# Patient Record
Sex: Male | Born: 1980 | Race: Black or African American | Hispanic: No | Marital: Married | Smoking: Former smoker
Health system: Southern US, Community
[De-identification: ages and names within clinical notes are randomized; demographics above are authoritative.]

## PROBLEM LIST (undated history)

## (undated) ENCOUNTER — Emergency Department (HOSPITAL_BASED_OUTPATIENT_CLINIC_OR_DEPARTMENT_OTHER): Admission: EM | Payer: Self-pay

## (undated) DIAGNOSIS — J40 Bronchitis, not specified as acute or chronic: Secondary | ICD-10-CM

---

## 2004-04-02 ENCOUNTER — Emergency Department (HOSPITAL_COMMUNITY): Admission: EM | Admit: 2004-04-02 | Discharge: 2004-04-02 | Payer: Self-pay | Admitting: Emergency Medicine

## 2014-07-04 ENCOUNTER — Encounter (HOSPITAL_BASED_OUTPATIENT_CLINIC_OR_DEPARTMENT_OTHER): Payer: Self-pay | Admitting: Emergency Medicine

## 2014-07-04 ENCOUNTER — Emergency Department (HOSPITAL_BASED_OUTPATIENT_CLINIC_OR_DEPARTMENT_OTHER)
Admission: EM | Admit: 2014-07-04 | Discharge: 2014-07-05 | Disposition: A | Discharge status: Home / Self Care | Payer: Self-pay | Attending: Emergency Medicine | Admitting: Emergency Medicine

## 2014-07-04 DIAGNOSIS — N342 Other urethritis: Secondary | ICD-10-CM | POA: Insufficient documentation

## 2014-07-04 LAB — URINALYSIS, ROUTINE W REFLEX MICROSCOPIC
Bilirubin Urine: NEGATIVE
GLUCOSE, UA: NEGATIVE mg/dL
HGB URINE DIPSTICK: NEGATIVE
KETONES UR: NEGATIVE mg/dL
Nitrite: NEGATIVE
PH: 8 (ref 5.0–8.0)
Protein, ur: NEGATIVE mg/dL
Specific Gravity, Urine: 1.024 (ref 1.005–1.030)
Urobilinogen, UA: 1 mg/dL (ref 0.0–1.0)

## 2014-07-04 LAB — URINE MICROSCOPIC-ADD ON

## 2014-07-04 MED ORDER — PHENAZOPYRIDINE HCL 100 MG PO TABS: 200.0000 mg | ORAL_TABLET | Freq: Once | ORAL | Status: DC

## 2014-07-04 MED ORDER — NITROFURANTOIN MONOHYD MACRO 100 MG PO CAPS: 100.0000 mg | ORAL_CAPSULE | Freq: Once | ORAL | Status: DC

## 2014-07-04 NOTE — ED Provider Notes (Signed)
CSN: [id number]     Arrival date & time 07/04/14  2314 History   This chart was scribed for Chrisy Hillebrand, MD by Octavia Heir, ED Scribe. This patient was seen in room MHT13/MHT13 and the patient's care was started at 11:57 PM.    Chief Complaint  Patient presents with  . Urinary Tract Infection     Patient is a 34 y.o. male presenting with penile discharge and dysuria. The history is provided by the patient. No language interpreter was used.  Penile Discharge This is a new problem. The current episode started more than 1 week ago. The problem occurs constantly. The problem has not changed since onset.Pertinent negatives include no chest pain, no abdominal pain, no headaches and no shortness of breath. Nothing aggravates the symptoms. Nothing relieves the symptoms. He has tried nothing for the symptoms. The treatment provided no relief.  Dysuria This is a new problem. The current episode started more than 1 week ago. The problem occurs constantly. The problem has not changed since onset.Pertinent negatives include no chest pain, no abdominal pain, no headaches and no shortness of breath. Nothing aggravates the symptoms. Nothing relieves the symptoms. He has tried nothing for the symptoms. The treatment provided no relief.    HPI Comments: Dwayne Hicks is a 34 y.o. male who presents to the Emergency Department complaining of penile discharge onset two weeks ago. Pt has associated dysuria. Pt was tested for STDs two weeks ago and notes his tests were negative. Pt denies any new partners and having history of UTI's in the past.   History reviewed. No pertinent past medical history. History reviewed. No pertinent past surgical history. History reviewed. No pertinent family history. History  Substance Use Topics  . Smoking status: Never Smoker   . Smokeless tobacco: Not on file  . Alcohol Use: Yes     Comment: occ    Review of Systems  Constitutional: Negative for fever.  Respiratory:  Negative for shortness of breath.   Cardiovascular: Negative for chest pain.  Gastrointestinal: Negative for abdominal pain.  Genitourinary: Positive for dysuria and discharge. Negative for frequency, flank pain, penile swelling, scrotal swelling, genital sores, penile pain and testicular pain.  Neurological: Negative for headaches.  All other systems reviewed and are negative.     Allergies  Review of patient's allergies indicates no known allergies.  Home Medications   Prior to Admission medications   Not on File   Triage vitals: BP 116/75 mmHg  Pulse 89  Temp(Src) 98.5 F (36.9 C) (Oral)  Resp 16  Ht 5' (1.524 m)  SpO2 100% Physical Exam  Constitutional: He is oriented to person, place, and time. He appears well-developed and well-nourished. No distress.  HENT:  Head: Normocephalic and atraumatic.  Mouth/Throat: Oropharynx is clear and moist.  Eyes: Conjunctivae and EOM are normal. Pupils are equal, round, and reactive to light. No scleral icterus.  Neck: Normal range of motion. Neck supple. No thyromegaly present.  Cardiovascular: Normal rate and regular rhythm.  Exam reveals no gallop and no friction rub.   No murmur heard. Pulmonary/Chest: Effort normal and breath sounds normal. No respiratory distress. He has no wheezes. He has no rales.  Abdominal: Soft. Bowel sounds are normal. He exhibits no distension. There is no tenderness. There is no rebound and no guarding.  Musculoskeletal: Normal range of motion.  Lymphadenopathy:    He has no cervical adenopathy.  Neurological: He is alert and oriented to person, place, and time. He has normal  reflexes.  Skin: Skin is warm and dry. No rash noted.  Psychiatric: He has a normal mood and affect. His behavior is normal.  Nursing note and vitals reviewed.   ED Course  Procedures  DIAGNOSTIC STUDIES: Oxygen Saturation is 100% on RA, normal by my interpretation.  COORDINATION OF CARE:  11:59 PM Discussed treatment plan  which includes follow up with urology, rocephin with pt at bedside and pt agreed to plan.  Labs Review Labs Reviewed  URINALYSIS, ROUTINE W REFLEX MICROSCOPIC (NOT AT [facility]) - Abnormal; Notable for the following:    APPearance TURBID (*)    Leukocytes, UA LARGE (*)    All other components within normal limits  URINE MICROSCOPIC-ADD ON - Abnormal; Notable for the following:    Bacteria, UA MANY (*)    All other components within normal limits    Imaging Review No results found.   EKG Interpretation None      MDM   Final diagnoses:  None    Will treat for STIs with rocephin at full UTI dose Im and azithromycin.  Will d/c on doxycycline.  .  No sexual activity until 7 days after all partners treated.  Will also provide follow up with urology.  Patient verbalizes understanding and agrees to follow up  I personally performed the services described in this documentation, which was scribed in my presence. The recorded information has been reviewed and is accurate.    Cy Blamer, MD 07/05/14 [phone]

## 2014-07-04 NOTE — ED Notes (Signed)
Patient states that he was test about 2 [facility] and it was negative. The patient reports that he continues to have a discharge and left flank pain

## 2014-07-05 ENCOUNTER — Encounter (HOSPITAL_BASED_OUTPATIENT_CLINIC_OR_DEPARTMENT_OTHER): Payer: Self-pay | Admitting: Emergency Medicine

## 2014-07-05 MED ORDER — CEFTRIAXONE SODIUM 1 G IJ SOLR
1.0000 g | Freq: Once | INTRAMUSCULAR | Status: AC
  Administered 2014-07-05: 1 g via INTRAMUSCULAR
  Filled 2014-07-05: qty 10

## 2014-07-05 MED ORDER — DOXYCYCLINE HYCLATE 100 MG PO CAPS: 100.0000 mg | ORAL_CAPSULE | Freq: Two times a day (BID) | ORAL | Status: AC

## 2014-07-05 NOTE — Discharge Instructions (Signed)
Urethritis °Urethritis is an inflammation of the tube through which urine exits your bladder (urethra).  °CAUSES °Urethritis is often caused by an infection in your urethra. The infection can be viral, like herpes. The infection can also be bacterial, like gonorrhea. °RISK FACTORS °Risk factors of urethritis include: °· Having sex without using a condom. °· Having multiple sexual partners. °· Having poor hygiene. °SIGNS AND SYMPTOMS °Symptoms of urethritis are less noticeable in women than in men. These symptoms include: °· Burning feeling when you urinate (dysuria). °· Discharge from your urethra. °· Blood in your urine (hematuria). °· Urinating more than usual. °DIAGNOSIS  °To confirm a diagnosis of urethritis, your health care provider will do the following: °· Ask about your sexual history. °· Perform a physical exam. °· Have you provide a sample of your urine for lab testing. °· Use a cotton swab to gently collect a sample from your urethra for lab testing. °TREATMENT  °It is important to treat urethritis. Depending on the cause, untreated urethritis may lead to serious genital infections and possibly infertility. Urethritis caused by a bacterial infection is treated with antibiotic medicine. All sexual partners must be treated.  °HOME CARE INSTRUCTIONS °· Do not have sex until the test results are known and treatment is completed, even if your symptoms go away before you finish treatment. °· If you were prescribed an antibiotic, finish it all even if you start to feel better. °SEEK MEDICAL CARE IF:  °· Your symptoms are not improved in 3 days. °· Your symptoms are getting worse. °· You develop abdominal pain or pelvic pain (in women). °· You develop joint pain. °· You have a fever. °SEEK IMMEDIATE MEDICAL CARE IF:  °· You have severe pain in the belly, back, or side. °· You have repeated vomiting. °MAKE SURE YOU: °· Understand these instructions. °· Will watch your condition. °· Will get help right away if you  are not doing well or get worse. °Document Released: 06/26/2000 Document Revised: 05/17/2013 Document Reviewed: 08/31/2012 °[employer]® Patient Information ©2015 [employer]. This information is not intended to replace advice given to you by your health care provider. Make sure you discuss any questions you have with your health care provider. ° °

## 2014-07-06 LAB — GC/CHLAMYDIA PROBE AMP (~~LOC~~) NOT AT ARMC
CHLAMYDIA, DNA PROBE: NEGATIVE
Neisseria Gonorrhea: POSITIVE — AB

## 2014-07-07 ENCOUNTER — Telehealth (HOSPITAL_BASED_OUTPATIENT_CLINIC_OR_DEPARTMENT_OTHER): Payer: Self-pay | Admitting: Emergency Medicine

## 2014-07-31 ENCOUNTER — Telehealth (HOSPITAL_BASED_OUTPATIENT_CLINIC_OR_DEPARTMENT_OTHER): Payer: Self-pay | Admitting: Emergency Medicine

## 2014-07-31 NOTE — Telephone Encounter (Signed)
Letter returned, no known forwarding address, lost to followup 

## 2014-08-27 ENCOUNTER — Encounter (HOSPITAL_BASED_OUTPATIENT_CLINIC_OR_DEPARTMENT_OTHER): Payer: Self-pay | Admitting: *Deleted

## 2014-08-27 ENCOUNTER — Emergency Department (HOSPITAL_BASED_OUTPATIENT_CLINIC_OR_DEPARTMENT_OTHER)
Admission: EM | Admit: 2014-08-27 | Discharge: 2014-08-27 | Disposition: A | Discharge status: Home / Self Care | Payer: Self-pay | Attending: Emergency Medicine | Admitting: Emergency Medicine

## 2014-08-27 DIAGNOSIS — Z87891 Personal history of nicotine dependence: Secondary | ICD-10-CM | POA: Insufficient documentation

## 2014-08-27 DIAGNOSIS — R002 Palpitations: Secondary | ICD-10-CM | POA: Insufficient documentation

## 2014-08-27 DIAGNOSIS — R42 Dizziness and giddiness: Secondary | ICD-10-CM | POA: Insufficient documentation

## 2014-08-27 DIAGNOSIS — Z8709 Personal history of other diseases of the respiratory system: Secondary | ICD-10-CM | POA: Insufficient documentation

## 2014-08-27 DIAGNOSIS — R200 Anesthesia of skin: Secondary | ICD-10-CM | POA: Insufficient documentation

## 2014-08-27 DIAGNOSIS — R079 Chest pain, unspecified: Secondary | ICD-10-CM | POA: Insufficient documentation

## 2014-08-27 DIAGNOSIS — R202 Paresthesia of skin: Secondary | ICD-10-CM | POA: Insufficient documentation

## 2014-08-27 HISTORY — DX: Bronchitis, not specified as acute or chronic: J40

## 2014-08-27 LAB — CBC WITH DIFFERENTIAL/PLATELET
Basophils Absolute: 0 10*3/uL (ref 0.0–0.1)
Basophils Relative: 0 % (ref 0–1)
EOS ABS: 0.1 10*3/uL (ref 0.0–0.7)
EOS PCT: 1 % (ref 0–5)
HCT: 45.6 % (ref 39.0–52.0)
Hemoglobin: 15.5 g/dL (ref 13.0–17.0)
LYMPHS PCT: 15 % (ref 12–46)
Lymphs Abs: 1.1 10*3/uL (ref 0.7–4.0)
MCH: 32.8 pg (ref 26.0–34.0)
MCHC: 34 g/dL (ref 30.0–36.0)
MCV: 96.4 fL (ref 78.0–100.0)
MONO ABS: 0.5 10*3/uL (ref 0.1–1.0)
Monocytes Relative: 7 % (ref 3–12)
NEUTROS ABS: 5.7 10*3/uL (ref 1.7–7.7)
NEUTROS PCT: 77 % (ref 43–77)
PLATELETS: 272 10*3/uL (ref 150–400)
RBC: 4.73 MIL/uL (ref 4.22–5.81)
RDW: 12.7 % (ref 11.5–15.5)
WBC: 7.4 10*3/uL (ref 4.0–10.5)

## 2014-08-27 LAB — BASIC METABOLIC PANEL
Anion gap: 10 (ref 5–15)
BUN: 11 mg/dL (ref 6–20)
CHLORIDE: 102 mmol/L (ref 101–111)
CO2: 28 mmol/L (ref 22–32)
Calcium: 10.2 mg/dL (ref 8.9–10.3)
Creatinine, Ser: 1.12 mg/dL (ref 0.61–1.24)
GFR calc non Af Amer: >60 mL/min (ref >60)
GLUCOSE: 86 mg/dL (ref 65–99)
POTASSIUM: 3.8 mmol/L (ref 3.5–5.1)
Sodium: 140 mmol/L (ref 135–145)

## 2014-08-27 LAB — TROPONIN I: Troponin I: <0.03 ng/mL (ref <0.031)

## 2014-08-27 MED ORDER — SODIUM CHLORIDE 0.9 % IV BOLUS (SEPSIS)
1000.0000 mL | Freq: Once | INTRAVENOUS | Status: AC
  Administered 2014-08-27: 1000 mL via INTRAVENOUS

## 2014-08-27 NOTE — ED Notes (Signed)
Pt reports "i think i'm having a stroke"- c/o fast heartbeat and dizzy x 4 days, left side of body tingling since 1530 today- speech clear, grips strong and equal, facial symmetry present

## 2014-08-27 NOTE — ED Provider Notes (Signed)
CSN: [id number]     Arrival date & time 08/27/14  1626 History  This chart was scribed for Blake Divine, MD by Placido Sou, ED scribe. This patient was seen in room MH07/MH07 and the patient's care was started at 4:51 PM.   Chief Complaint  Patient presents with  . Tingling   Patient is a 34 y.o. male presenting with dizziness. The history is provided by the patient. No language interpreter was used.  Dizziness Quality:  Lightheadedness Severity:  Moderate Onset quality:  Sudden Duration:  4 days Timing:  Intermittent Progression:  Partially resolved Chronicity:  New Associated symptoms: palpitations     HPI Comments: Dwayne Hicks is a 34 y.o. male who presents to the Emergency Department complaining of sudden, acute, mild, intermittent, left sided numbness with onset 4 days ago. He notes that while driving 4 days ago he experienced a racing sensation of his heart and once he arrived back home began feeling numbness to his left side with associated dizziness and mild chest tighness. He notes multiple occurences of these episodes in the past 4 days and further notes that they typically last from 5-30 minutes. Pt notes taking an unprescribed valium 5 days ago before the onset of his symptoms. He notes that his symptoms have alleviated quite a bit and currently are mildly present. He denies consuming excessive amounts of caffeine, ETOH or any cocaine usage. He does report being under stress, but he doesn't think it's any worse than most people deal with. Pt denies any known health issues or being on any current medications. He is unsure of any fhx due to being adopted. Pt denies any other associated symptoms.   Past Medical History  Diagnosis Date  . Bronchitis    History reviewed. No pertinent past surgical history. No family history on file. Social History  Substance Use Topics  . Smoking status: Former Games developer  . Smokeless tobacco: Never Used  . Alcohol Use: Yes     Comment: 4  drinks/weekend    Review of Systems  Respiratory: Positive for chest tightness.   Cardiovascular: Positive for palpitations.  Neurological: Positive for dizziness and numbness. Negative for syncope.  All other systems reviewed and are negative.   Allergies  Review of patient's allergies indicates no known allergies.  Home Medications   Prior to Admission medications   Medication Sig Start Date End Date Taking? Authorizing Provider  doxycycline (VIBRAMYCIN) 100 MG capsule Take 1 capsule (100 mg total) by mouth 2 (two) times daily. One po bid x 7 days 07/05/14   April Palumbo, MD   BP 147/87 mmHg  Pulse 76  Temp(Src) 98.6 F (37 C) (Oral)  Resp 18  Ht  (1.702 m)  Wt 140 lb (63.504 kg)  BMI 21.92 kg/m2  SpO2 96% Physical Exam  Constitutional: He is oriented to person, place, and time. He appears well-developed and well-nourished. No distress.  Thin  HENT:  Head: Normocephalic and atraumatic.  Mouth/Throat: Oropharynx is clear and moist.  Eyes: Conjunctivae are normal. Pupils are equal, round, and reactive to light. No scleral icterus.  Neck: Neck supple.  Cardiovascular: Normal rate, regular rhythm, normal heart sounds and intact distal pulses.   No murmur heard. Pulmonary/Chest: Effort normal and breath sounds normal. No stridor. No respiratory distress. He has no wheezes. He has no rales.  Abdominal: Soft. He exhibits no distension. There is no tenderness.  Musculoskeletal: Normal range of motion. He exhibits no edema.  Neurological: He is alert and oriented  to person, place, and time. He has normal strength. No cranial nerve deficit or sensory deficit. Coordination normal. GCS eye subscore is 4. GCS verbal subscore is 5. GCS motor subscore is 6.  Skin: Skin is warm and dry. No rash noted.  Psychiatric: He has a normal mood and affect. His behavior is normal.  Nursing note and vitals reviewed.   ED Course  Procedures  DIAGNOSTIC STUDIES: Oxygen Saturation is 96%  on RA, normal by my interpretation.    COORDINATION OF CARE: 5:00 PM Discussed treatment plan with pt at bedside and pt agreed to plan.  Labs Review Labs Reviewed  CBC WITH DIFFERENTIAL/PLATELET  BASIC METABOLIC PANEL  TROPONIN I    Imaging Review No results found. I, Blake Divine, MD, personally reviewed and evaluated these images and lab results as part of my medical decision-making.   EKG Interpretation   Date/Time:  Saturday August 27 2014 17:04:26 EDT Ventricular Rate:  65 PR Interval:  150 QRS Duration: 84 QT Interval:  368 QTC Calculation: 382 R Axis:   -8 Text Interpretation:  Normal sinus rhythm Left ventricular hypertrophy ST  elevation, consider early repolarization, pericarditis, or injury Abnormal  ECG No old tracing to compare Confirmed by [facility]  MD, TREY ([id number]) on  08/27/2014 5:16:35 PM      MDM   Final diagnoses:  Palpitations    34 year old male who presents with palpitations, dizziness, near syncope, and tingling mostly on the left side of his body. The symptoms have been off and on for the past few days. He has not passed out.  Currently, he is asymptomatic with stable vitals. His EKG shows normal sinus rhythm without delta waves. Does show a pattern consistent with early repolarization, expected given his age and body type. He does admit to being outside in the heat frequently and reports that he's been very hot recently.  This could be contributing to his symptoms. Also, anxiety could play a role. Less likely is a cardiac arrhythmia. His workup is reassuring here. Will DC home with outpatient follow-up.  I personally performed the services described in this documentation, which was scribed in my presence. The recorded information has been reviewed and is accurate.   Blake Divine, MD 08/27/14 [phone]

## 2014-08-27 NOTE — ED Notes (Addendum)
Pt complains of left sided tingles about 1 hour ago. Pt complains of heart racing, sweating, "feeling of passing out" x4days. Denies any blurred vision or headache

## 2014-08-27 NOTE — ED Notes (Signed)
Pt presents with c/o "fast heart rate" and " I think I am having a stroke", x 4 days, POX on RA 98% with HR 74/min, FAST Assessment: negative

## 2014-08-27 NOTE — Discharge Instructions (Signed)
Palpitations °A palpitation is the feeling that your heartbeat is irregular or is faster than normal. It may feel like your heart is fluttering or skipping a beat. Palpitations are usually not a serious problem. However, in some cases, you may need further medical evaluation. °CAUSES  °Palpitations can be caused by: °· Smoking. °· Caffeine or other stimulants, such as diet pills or energy drinks. °· Alcohol. °· Stress and anxiety. °· Strenuous physical activity. °· Fatigue. °· Certain medicines. °· Heart disease, especially if you have a history of irregular heart rhythms (arrhythmias), such as atrial fibrillation, atrial flutter, or supraventricular tachycardia. °· An improperly working pacemaker or defibrillator. °DIAGNOSIS  °To find the cause of your palpitations, your health care provider will take your medical history and perform a physical exam. Your health care provider may also have you take a test called an ambulatory electrocardiogram (ECG). An ECG records your heartbeat patterns over a 24-hour period. You may also have other tests, such as: °· Transthoracic echocardiogram (TTE). During echocardiography, sound waves are used to evaluate how blood flows through your heart. °· Transesophageal echocardiogram (TEE). °· Cardiac monitoring. This allows your health care provider to monitor your heart rate and rhythm in real time. °· Holter monitor. This is a portable device that records your heartbeat and can help diagnose heart arrhythmias. It allows your health care provider to track your heart activity for several days, if needed. °· Stress tests by exercise or by giving medicine that makes the heart beat faster. °TREATMENT  °Treatment of palpitations depends on the cause of your symptoms and can vary greatly. Most cases of palpitations do not require any treatment other than time, relaxation, and monitoring your symptoms. Other causes, such as atrial fibrillation, atrial flutter, or supraventricular  tachycardia, usually require further treatment. °HOME CARE INSTRUCTIONS  °· Avoid: °¨ Caffeinated coffee, tea, soft drinks, diet pills, and energy drinks. °¨ Chocolate. °¨ Alcohol. °· Stop smoking if you smoke. °· Reduce your stress and anxiety. Things that can help you relax include: °¨ A method of controlling things in your body, such as your heartbeats, with your mind (biofeedback). °¨ Yoga. °¨ Meditation. °¨ Physical activity such as swimming, jogging, or walking. °· Get plenty of rest and sleep. °SEEK MEDICAL CARE IF:  °· You continue to have a fast or irregular heartbeat beyond 24 hours. °· Your palpitations occur more often. °SEEK IMMEDIATE MEDICAL CARE IF: °· You have chest pain or shortness of breath. °· You have a severe headache. °· You feel dizzy or you faint. °MAKE SURE YOU: °· Understand these instructions. °· Will watch your condition. °· Will get help right away if you are not doing well or get worse. °Document Released: 12/29/1999 Document Revised: 01/05/2013 Document Reviewed: 03/01/2011 °ExitCare® Patient Information ©2015 [employer]. This information is not intended to replace advice given to you by your health care provider. Make sure you discuss any questions you have with your health care provider. ° ° °Emergency Department Resource Guide °1) Find a Doctor and Pay Out of Pocket °Although you won't have to find out who is covered by your insurance plan, it is a good idea to ask around and get recommendations. You will then need to call the office and see if the doctor you have chosen will accept you as a new patient and what types of options they offer for patients who are self-pay. Some doctors offer discounts or will set up payment plans for their patients who do not have insurance, but   you will need to ask so you aren't surprised when you get to your appointment.  2) Contact Your Local Health Department Not all health departments have doctors that can see patients for sick visits, but  many do, so it is worth a call to see if yours does. If you don't know where your local health department is, you can check in your phone book. The CDC also has a tool to help you locate your state's health department, and many state websites also have listings of all of their local health departments.  3) Find a Elim Clinic If your illness is not likely to be very severe or complicated, you may want to try a walk in clinic. These are popping up all over the country in pharmacies, drugstores, and shopping centers. They're usually staffed by nurse practitioners or physician assistants that have been trained to treat common illnesses and complaints. They're usually fairly quick and inexpensive. However, if you have serious medical issues or chronic medical problems, these are probably not your best option.  No Primary Care Doctor: - Call Health Connect at  [phone] - they can help you locate a primary care doctor that  accepts your insurance, provides certain services, etc. - Physician Referral Service- [phone]  Chronic Pain Problems: Organization         Address  Phone   Notes  [facility]  [phone] Patients need to be referred by their primary care doctor.   Medication Assistance: Organization         Address  Phone   Notes  [facility] Medication [facility] [address] [place], [place] [address] [phone] --Must be a resident of [facility] -- Must have NO insurance coverage whatsoever (no Medicaid/ Medicare, etc.) -- The pt. MUST have a primary care doctor that directs their care regularly and follows them in the community   [facility]ist  [phone]   [employer]  [phone]    Agencies that provide inexpensive medical care: Organization         Address  Phone   Notes  [place]  [phone]   [facility]    [phone]   [facility] [place], [place] [address] [phone]   [place] [address], [place] [phone]   Planned Parenthood    [phone]   [facility]    [phone]   [place] and [place] [address], [place] Phone:  [phone], Fax:  [phone] Hours of Operation:  9 am - 6 pm, M-F.  Also accepts Medicaid/Medicare and self-pay.  [facility] [place] [address], [address], [place] Phone: [phone], Fax: [phone]. Hours of Operation:  8:30 am - 5:30 pm, M-F.  Also accepts Medicaid and self-pay.  [facility] [address] [place] Phone: [phone]   [place], [place], [place] [phone], Ext. [phone] Mondays & Thursdays: 7-9 AM.  First 15 patients are seen on a first come, first serve basis.    [place] Providers:  Organization         Address  Phone   Notes  [facility] [address], [place] [phone] Also accepts self-pay patients.  [facility] [address], [place]  [place]  [phone]   [place], [address], [place] [phone]   [place] [address] [place] [phone]   Lucianne Lei [address], [place]   [phone] Only accepts [place] Access [place] patients after they have their name applied to their card.   Self-Pay (no insurance) in [facility]:  Organization         Address  Phone   Notes  Sickle Cell Patients, [facility] [place] [phone]   [facility] [place] [phone]   [facility] [place]  [place], [place], [place] [phone]   [facility]/Dr. Osei-Bonsu  [address] [place] or  [address], [place] [phone] Phone number for both [place] and [place] locations is the same.  [facility] and [facility] [address] [place] [phone]   [facility] [address], [place] or [address] [phone]   [facility] [address], [place] [phone], phone; [phone], fax Sees patients 1st and 3rd Saturday of every month.  Must not qualify for public or private insurance (i.e. Medicaid, Medicare, [place] Health Choice, Veterans' Benefits)  Household income should be no more than 200% of the poverty level The clinic cannot treat you if you are pregnant or think you are pregnant  Sexually transmitted diseases are not treated at the clinic.    Dental Care: Organization         Address  Phone  Notes  [facility] Department of [facility] [phone] Accepts children up to age 31 who are enrolled in [place] or [place]; pregnant women with a Medicaid card; and children who have applied for Medicaid or [place] Health Choice, but were declined, whose parents can pay a reduced fee at time of service.  [facility] Department of [facility]  [address], [place] [phone] Accepts children up to age 43 who are enrolled in [place] or [place]; pregnant women with a Medicaid card; and children who have applied for Medicaid or [place] Health Choice, but were declined, whose parents can pay a reduced fee at time of service.  [place] Adult [facility] Access PROGRAM  [place] [phone] Patients are seen by appointment only. Walk-ins are not accepted. [place] will see patients 41 years of age and older. Monday - Tuesday (8am-5pm) Most Wednesdays (8:30-5pm) $30 per visit, cash only  [facility] Adult Dental Access PROGRAM  [address], [facility] [phone] Patients are seen by appointment only. Walk-ins are not accepted. [place] will see patients 43 years of age and older. One Wednesday Evening (Monthly: Volunteer Based).  $30 per visit, cash only  [place]  [phone] for adults; Children under age 67, call Graduate Pediatric Dentistry at [phone]. Children aged 40-14, please call [phone] to request a pediatric application.  Dental services are provided in all areas of dental care including fillings, crowns and bridges, complete and partial dentures, implants, gum treatment, root canals, and extractions. Preventive care is also provided. Treatment is provided to both adults and children. Patients are selected via a lottery  and there is often a waiting list.   [facility] [address] [place]  [phone] [email or web address]   [facility] [address] [place], [place] [phone], Ext. [phone] Second and Fourth Thursday of each month, opens at 6:30 AM; Clinic ends at 9 AM.  Patients are seen on a first-come first-served basis, and a limited number are seen during each clinic.   [facility]  [address] Hillard Danker [place], [place] [phone]   Eligibility Requirements You must have lived in [place], [place], or [place] counties for at least the last three months.   You cannot be eligible for state or federal sponsored [employer], including [employer], [place], or [employer].   You generally cannot be eligible for healthcare insurance through your employer.    How to apply: Eligibility screenings are held every Tuesday and Wednesday afternoon from 1:00 pm until 4:00 pm. You do not need an appointment for the interview!  [facility] [address], [place], [place]   [place]  Blue Department  [place]  [phone]    Behavioral Health Resources in the Community: Intensive Outpatient Programs Organization         Address  Phone  Notes  [place]. [address] [place], [place] [phone]   [facility] Outpatient [address] [place], [place]   ADS: Alcohol & Drug Svcs [address], [place], [place]   [place] [address],  [place], [place] or [phone]   Substance Abuse Resources Organization         Address  Phone  Notes  Alcohol and Drug Services  [phone]   [place]  [phone]   [facility] [place]   Chinita Pester  [phone]   Residential & Outpatient Substance Abuse Program  [phone]   Psychological Services Organization         Address  Phone  Notes  [facility] [place]  [place]  [phone]   [place] [address], [place] or [phone]    Mobile Crisis Teams Organization         Address  Phone  Notes  [facility], Mobile Crisis Care Unit  [phone]   [facility] Psychotherapeutic Services  [address] [place], [place]   [place] [address], [place] [phone]    Self-Help/Support Groups Organization         Address  Phone             Notes  [place] - variety of support groups  [place] Call for more information  Narcotics Anonymous (NA), [facility] [address], [employer] [place]  2 meetings at this location   Special educational needs teacher         Address  Phone  Notes  ASAP Residential Treatment [place],    [place]  [phone]   [facility]  [address], [place] [id number], [place], [place]   [place], [place] [phone]  Admissions: 8am-3pm M-F  Incentives Substance [place] [address]    [place], [place] [phone]   [facility] [address], [place],  [place] [phone]   [facility] [address]  [place], [place]   [facility] - Intensive Outpatient [address], [place], [place]   [facility] ([place].,  [place], [place] [phone] or [phone]   [facility] (RTS) [address] [place], [place] Accepts Medicaid  [facility] [place] [address],  [place] [phone] Substance Abuse/Addiction Treatment   [facility] Organization         Address  Phone  Notes  [facility]  [phone]   Domenic Schwab, PhD [address] Arlis Porta [place], [place]   [phone] or [phone]   [place], [place] [phone]   [address], [place], [place] [phone] Insurance/Medicaid/sponsorship through [facility] and Families [address] [place]                                    [place], [place] [phone] [place] [address], [place] [phone]    Dr. Adele Schilder  [phone]   [facility] Dept. 1) [address] [place] 2) [place] 3)  [place] [address], [place] [phone]  [phone]   [place] [phone] or [phone] (After Hours)

## 2014-08-28 ENCOUNTER — Emergency Department (HOSPITAL_BASED_OUTPATIENT_CLINIC_OR_DEPARTMENT_OTHER)
Admission: EM | Admit: 2014-08-28 | Discharge: 2014-08-29 | Disposition: A | Discharge status: Home / Self Care | Payer: Self-pay | Attending: Emergency Medicine | Admitting: Emergency Medicine

## 2014-08-28 ENCOUNTER — Encounter (HOSPITAL_BASED_OUTPATIENT_CLINIC_OR_DEPARTMENT_OTHER): Payer: Self-pay | Admitting: Emergency Medicine

## 2014-08-28 DIAGNOSIS — Z8709 Personal history of other diseases of the respiratory system: Secondary | ICD-10-CM | POA: Insufficient documentation

## 2014-08-28 DIAGNOSIS — R202 Paresthesia of skin: Secondary | ICD-10-CM | POA: Insufficient documentation

## 2014-08-28 DIAGNOSIS — Z87891 Personal history of nicotine dependence: Secondary | ICD-10-CM | POA: Insufficient documentation

## 2014-08-28 LAB — URINALYSIS, ROUTINE W REFLEX MICROSCOPIC
BILIRUBIN URINE: NEGATIVE
GLUCOSE, UA: NEGATIVE mg/dL
Hgb urine dipstick: NEGATIVE
Ketones, ur: NEGATIVE mg/dL
LEUKOCYTES UA: NEGATIVE
NITRITE: NEGATIVE
PH: 7 (ref 5.0–8.0)
Protein, ur: NEGATIVE mg/dL
Specific Gravity, Urine: 1.009 (ref 1.005–1.030)
Urobilinogen, UA: 1 mg/dL (ref 0.0–1.0)

## 2014-08-28 NOTE — ED Notes (Addendum)
Patient states that he is having intermittent tingling to different parts of his body after riding with the window down. Was here yesterday for the same, however he reports its worse. Patient states that he thinks he has a "blot clot and he wants an x-ray because maybe his heart is over grown"

## 2014-08-28 NOTE — ED Provider Notes (Signed)
CSN: [id number]     Arrival date & time 08/28/14  2322 History  This chart was scribed for Paula Libra, MD by Placido Sou, ED scribe. This patient was seen in room MH02/MH02 and the patient's care was started at 11:51 PM.   Chief Complaint  Patient presents with  . Numbness   HPI HPI Comments: Dwayne Hicks is a 34 y.o. male, who [facility], presents to the Emergency Department complaining of left arm and left leg tingling with onset 30 minutes PTA. He notes while driving experiencing an associated near syncopal feeling, mild pain behind his left ear, nausea and mild left-sided anterior CP he describes as sharp and pinching and lasting about a second. He also notes that his veins were more pronounced in his left arm which is confirmed by his girlfriend. That venous distention has resolved. Pt denies SOB, dehydration, change in urinary frequency, vomiting, diarrhea, fever or chills.   Past Medical History  Diagnosis Date  . Bronchitis    History reviewed. No pertinent past surgical history. History reviewed. No pertinent family history. Social History  Substance Use Topics  . Smoking status: Former Games developer  . Smokeless tobacco: Never Used  . Alcohol Use: Yes     Comment: 4 drinks/weekend    Review of Systems  All other systems reviewed and are negative.  Allergies  Review of patient's allergies indicates no known allergies.  Home Medications   Prior to Admission medications   Medication Sig Start Date End Date Taking? Authorizing Provider  doxycycline (VIBRAMYCIN) 100 MG capsule Take 1 capsule (100 mg total) by mouth 2 (two) times daily. One po bid x 7 days 07/05/14   April Palumbo, MD   BP 130/75 mmHg  Pulse 66  Temp(Src) 98.9 F (37.2 C) (Oral)  Resp 18  Ht 5\' 7"  (1.702 m)  Wt 140 lb (63.504 kg)  BMI 21.92 kg/m2  SpO2 100%   Physical Exam  General: Well-developed, well-nourished male in no acute distress; appearance consistent with age of  record HENT: normocephalic; atraumatic Eyes: pupils equal, round and reactive to light; extraocular muscles intact Neck: supple Heart: regular rate and rhythm; no murmurs, rubs or gallops Lungs: clear to auscultation bilaterally Abdomen: soft; nondistended; nontender; no masses or hepatosplenomegaly; bowel sounds present Extremities: No deformity; full range of motion; pulses normal and symmetric; no venous engorgement seen; no edema Neurologic: Awake, alert and oriented; motor function intact in all extremities and symmetric; no facial droop Skin: Warm and dry Psychiatric: Normal mood and affect  ED Course  Procedures  DIAGNOSTIC STUDIES: Oxygen Saturation is 100% on RA, normal by my interpretation.    COORDINATION OF CARE: 11:59 PM Discussed treatment plan with pt at bedside and pt agreed to plan.   MDM   Nursing notes and vitals signs, including pulse oximetry, reviewed.  Summary of this visit's results, reviewed by myself:  Labs:  Results for orders placed or performed during the hospital encounter of 08/28/14 (from the past 24 hour(s))  Urinalysis, Routine w reflex microscopic (not at [facility])     Status: None   Collection Time: 08/28/14 11:34 PM  Result Value Ref Range   Color, Urine YELLOW YELLOW   APPearance CLEAR CLEAR   Specific Gravity, Urine 1.009 1.005 - 1.030   pH 7.0 5.0 - 8.0   Glucose, UA NEGATIVE NEGATIVE mg/dL   Hgb urine dipstick NEGATIVE NEGATIVE   Bilirubin Urine NEGATIVE NEGATIVE   Ketones, ur NEGATIVE NEGATIVE mg/dL  Protein, ur NEGATIVE NEGATIVE mg/dL   Urobilinogen, UA 1.0 0.0 - 1.0 mg/dL   Nitrite NEGATIVE NEGATIVE   Leukocytes, UA NEGATIVE NEGATIVE  D-dimer, quantitative (not at [facility])     Status: Abnormal   Collection Time: 08/29/14 12:11 AM  Result Value Ref Range   D-Dimer, Quant 0.60 (H) 0.00 - 0.48 ug/mL-FEU    Imaging Studies: Dg Chest 2 View  08/29/2014   CLINICAL DATA:  LEFT hand and leg tingling beginning today, near syncope.  Nausea mild LEFT chest pain. History of bronchitis.  EXAM: CHEST  2 VIEW  COMPARISON:  None available for comparison at time of study interpretation.  FINDINGS: Cardiomediastinal silhouette is normal. The lungs are clear without pleural effusions or focal consolidations. Trachea projects midline and there is no pneumothorax. Soft tissue planes and included osseous structures are non-suspicious.  IMPRESSION: No active cardiopulmonary disease.   Electronically Signed   By: Awilda Metro M.D.   On: 08/29/2014 01:41   2:49 AM The patient states he has been feeling improved other than occasional sharp stabbing pains in his chest that last about a second. These pains move from his pectoral region to his right chest to his abdomen in no specific pattern. He was advised that sharp fleeting pain such as this are not typically associated with cardiac disease. Given his elevated d-dimer and history of left upper extremity venous distention we will have him return for a Doppler ultrasound.  I personally performed the services described in this documentation, which was scribed in my presence. The recorded information has been reviewed and is accurate.   Paula Libra, MD 08/29/14 [phone]

## 2014-08-29 ENCOUNTER — Emergency Department (HOSPITAL_BASED_OUTPATIENT_CLINIC_OR_DEPARTMENT_OTHER): Payer: Self-pay

## 2014-08-29 ENCOUNTER — Ambulatory Visit (HOSPITAL_BASED_OUTPATIENT_CLINIC_OR_DEPARTMENT_OTHER)
Admission: RE | Admit: 2014-08-29 | Discharge: 2014-08-29 | Disposition: A | Discharge status: Home / Self Care | Payer: Self-pay | Source: Ambulatory Visit | Attending: Emergency Medicine | Admitting: Emergency Medicine

## 2014-08-29 DIAGNOSIS — R200 Anesthesia of skin: Secondary | ICD-10-CM | POA: Insufficient documentation

## 2014-08-29 LAB — D-DIMER, QUANTITATIVE: D-Dimer, Quant: 0.6 ug/mL-FEU — ABNORMAL HIGH (ref 0.00–0.48)

## 2014-08-29 NOTE — ED Notes (Addendum)
Dr. Read Drivers in to speak with pt/ family about results, plan and f/u.  appt scheduled for venous duplex study of LUE.

## 2014-08-29 NOTE — ED Notes (Signed)
Pt seen by EDP prior to RN assessment, see MD notes, orders received and initiated. Pt alert, NAD, calm, interactive, resps e/u, speaking in clear complete sentences, skin W&D, no dyspnea noted, family at [facility].

## 2014-08-29 NOTE — ED Provider Notes (Signed)
Given [place] results by radiology tech, negative DVT study.  I spoke with the pt, as he had a positive d-dimer and complained of chest pain throughout last 2 visits. The pain is very mild in nature and his primary complaint is numbness and tingling of the left side. He has no history of neck pain or other symptoms. He denies shortness of breath. I offered the patient evaluation for pulmonary embolism and discussed CT scan, the patient refused and elected to follow-up with his primary care doctor. Given that he has had a normal heart rate, is not currently dyspneic with chest pain, consider this a reasonable plan, and the pt appears competent to make decisions.    Mirian Mo, MD 08/29/14 [phone]

## 2014-08-30 ENCOUNTER — Emergency Department (HOSPITAL_BASED_OUTPATIENT_CLINIC_OR_DEPARTMENT_OTHER): Payer: Self-pay

## 2014-08-30 ENCOUNTER — Encounter (HOSPITAL_BASED_OUTPATIENT_CLINIC_OR_DEPARTMENT_OTHER): Payer: Self-pay | Admitting: *Deleted

## 2014-08-30 ENCOUNTER — Emergency Department (HOSPITAL_BASED_OUTPATIENT_CLINIC_OR_DEPARTMENT_OTHER)
Admission: EM | Admit: 2014-08-30 | Discharge: 2014-08-30 | Disposition: A | Discharge status: Home / Self Care | Payer: Self-pay | Attending: Emergency Medicine | Admitting: Emergency Medicine

## 2014-08-30 DIAGNOSIS — R079 Chest pain, unspecified: Secondary | ICD-10-CM | POA: Insufficient documentation

## 2014-08-30 DIAGNOSIS — F419 Anxiety disorder, unspecified: Secondary | ICD-10-CM | POA: Insufficient documentation

## 2014-08-30 DIAGNOSIS — Z792 Long term (current) use of antibiotics: Secondary | ICD-10-CM | POA: Insufficient documentation

## 2014-08-30 DIAGNOSIS — R0602 Shortness of breath: Secondary | ICD-10-CM | POA: Insufficient documentation

## 2014-08-30 DIAGNOSIS — F439 Reaction to severe stress, unspecified: Secondary | ICD-10-CM | POA: Insufficient documentation

## 2014-08-30 DIAGNOSIS — Z87891 Personal history of nicotine dependence: Secondary | ICD-10-CM | POA: Insufficient documentation

## 2014-08-30 DIAGNOSIS — Z8709 Personal history of other diseases of the respiratory system: Secondary | ICD-10-CM | POA: Insufficient documentation

## 2014-08-30 MED ORDER — IOHEXOL 350 MG/ML SOLN
100.0000 mL | Freq: Once | INTRAVENOUS | Status: AC | PRN
  Administered 2014-08-30: 100 mL via INTRAVENOUS

## 2014-08-30 NOTE — ED Provider Notes (Signed)
CSN: [id number]     Arrival date & time 08/30/14  1220 History   First MD Initiated Contact with Patient 08/30/14 1415     Chief Complaint  Patient presents with  . Shortness of Breath     (Consider location/radiation/quality/duration/timing/severity/associated sxs/prior Treatment) HPI Comments: 34 year old male presenting "for a CT scan". He has been seen 3 times in the emergency department over the past 3 days reporting shortness of breath and palpitations. He was sent for an upper extremity venous duplex yesterday which was negative. The patient was found to have an elevated d-dimer and was offered a CT angiogram yesterday and patient had to leave. He is returning today to get the CT angiogram. No new symptoms, reports continued palpitations and shortness of breath. Having difficulty explaining the shortness of breath. Reports occasional, random sharp, stabbing pains in the left side of his chest that have been ongoing "for a while". No history of blood clots. He is under some stress. Had negative workups other than the elevated d-dimer.  The history is provided by the patient and medical records.    Past Medical History  Diagnosis Date  . Bronchitis    History reviewed. No pertinent past surgical history. No family history on file. Social History  Substance Use Topics  . Smoking status: Former Games developer  . Smokeless tobacco: Never Used  . Alcohol Use: Yes     Comment: 4 drinks/weekend    Review of Systems  Respiratory: Positive for shortness of breath.   Cardiovascular: Positive for chest pain and palpitations.  All other systems reviewed and are negative.     Allergies  Review of patient's allergies indicates no known allergies.  Home Medications   Prior to Admission medications   Medication Sig Start Date End Date Taking? Authorizing Provider  doxycycline (VIBRAMYCIN) 100 MG capsule Take 1 capsule (100 mg total) by mouth 2 (two) times daily. One po bid x 7 days 07/05/14    April Palumbo, MD   BP 144/81 mmHg  Pulse 92  Temp(Src) 98 F (36.7 C) (Oral)  Resp 20  Ht 5\' 7"  (1.702 m)  Wt 140 lb (63.504 kg)  BMI 21.92 kg/m2  SpO2 100% Physical Exam  Constitutional: He is oriented to person, place, and time. He appears well-developed and well-nourished. No distress.  HENT:  Head: Normocephalic and atraumatic.  Mouth/Throat: Oropharynx is clear and moist.  Eyes: Conjunctivae and EOM are normal. Pupils are equal, round, and reactive to light.  Neck: Normal range of motion. Neck supple. No JVD present.  Cardiovascular: Normal rate, regular rhythm, normal heart sounds and intact distal pulses.   No extremity edema.  Pulmonary/Chest: Effort normal and breath sounds normal. No respiratory distress.  Abdominal: Soft. Bowel sounds are normal. There is no tenderness.  Musculoskeletal: Normal range of motion. He exhibits no edema.  Neurological: He is alert and oriented to person, place, and time. He has normal strength. No sensory deficit.  Speech fluent, goal oriented. Moves extremities without ataxia. Equal grip strength bilateral.  Skin: Skin is warm and dry. He is not diaphoretic.  Psychiatric: His behavior is normal. His mood appears anxious. His speech is rapid and/or pressured.  Nursing note and vitals reviewed.   ED Course  Procedures (including critical care time) Labs Review Labs Reviewed - No data to display  Imaging Review Dg Chest 2 View  08/29/2014   CLINICAL DATA:  LEFT hand and leg tingling beginning today, near syncope. Nausea mild LEFT chest pain. History of bronchitis.  EXAM: CHEST  2 VIEW  COMPARISON:  None available for comparison at time of study interpretation.  FINDINGS: Cardiomediastinal silhouette is normal. The lungs are clear without pleural effusions or focal consolidations. Trachea projects midline and there is no pneumothorax. Soft tissue planes and included osseous structures are non-suspicious.  IMPRESSION: No active  cardiopulmonary disease.   Electronically Signed   By: Awilda Metro M.D.   On: 08/29/2014 01:41   Ct Angio Chest Pe W/cm &/or Wo Cm  08/30/2014   CLINICAL DATA:  Shortness of breath and wheezing for several days  EXAM: CT ANGIOGRAPHY CHEST WITH CONTRAST  TECHNIQUE: Multidetector CT imaging of the chest was performed using the standard protocol during bolus administration of intravenous contrast. Multiplanar CT image reconstructions and MIPs were obtained to evaluate the vascular anatomy.  CONTRAST:  OMNIPAQUE IOHEXOL 350 MG/ML SOLN  COMPARISON:  Chest radiograph August 29, 2014  FINDINGS: There is no demonstrable pulmonary embolus. There is no thoracic aortic aneurysm or dissection. Visualized great vessels appear normal.  Lungs are clear. Thyroid appears normal. No demonstrable thoracic adenopathy. Pericardium is not thickened.  Visualized upper abdominal structures appear unremarkable.  No blastic or lytic bone lesions.  Review of the MIP images confirms the above findings.  IMPRESSION: No demonstrable pulmonary embolus. Lungs clear. No demonstrable thoracic adenopathy.   Electronically Signed   By: Bretta Bang III M.D.   On: 08/30/2014 15:01   US Venous Img Upper Uni Left  08/29/2014   CLINICAL DATA:  Left thumb numbness  EXAM: LEFT UPPER VENOUS DUPLEX ULTRASOUND  TECHNIQUE: Doppler venous assessment of the left upper extremity deep venous system was performed, including characterization of spectral flow, compressibility, and phasicity.  COMPARISON:  None.  FINDINGS: The left internal jugular vein is compressible and patent by color flow imaging. There is a normal venous waveform.  The left subclavian vein is patent by color flow imaging with a normal-appearing venous waveform.  There is complete compressibility of the left axillary, brachial, radial, and ulnar veins.  Doppler analysis of the deep venous system in the left arm demonstrates respiratory phasicity.  No evidence of superficial  vein thrombosis.  IMPRESSION: No evidence of left upper extremity DVT.   Electronically Signed   By: Jolaine Click M.D.   On: 08/29/2014 16:47   I have personally reviewed and evaluated these images and lab results as part of my medical decision-making.   EKG Interpretation None      MDM   Final diagnoses:  Shortness of breath  Stress  Nontoxic appearing, NAD. Vital signs stable. CT negative for PE. Fourth visit in 3 days. Most likely from stress/anxiety. Negative workups. Advised f/u with PCP to discuss stress/anxiety which are more than likely causing panic attacks. Stable for d/c. Return precautions given. Patient states understanding of treatment care plan and is agreeable.  Kathrynn Speed, PA-C 08/30/14 1525  Pricilla Loveless, MD 09/01/14 [phone]

## 2014-08-30 NOTE — ED Notes (Signed)
D/c home. Resources for f/u with PCP disucussed

## 2014-08-30 NOTE — ED Notes (Signed)
Patient transported to CT 

## 2014-08-30 NOTE — ED Notes (Signed)
Patient walked from fast track to room 10. Patient asking for something to eat states he hasn't ate all day. Asked RN Amy, patient advised me that patient can't have anything until after results are back.

## 2014-08-30 NOTE — Discharge Instructions (Signed)
Follow-up with your primary care physician to discuss stress and anxiety. Your CAT scan today was normal.  Shortness of Breath Shortness of breath means you have trouble breathing. It could also mean that you have a medical problem. You should get immediate medical care for shortness of breath. CAUSES   Not enough oxygen in the air such as with high altitudes or a smoke-filled room.  Certain lung diseases, infections, or problems.  Heart disease or conditions, such as angina or heart failure.  Low red blood cells (anemia).  Poor physical fitness, which can cause shortness of breath when you exercise.  Chest or back injuries or stiffness.  Being overweight.  Smoking.  Anxiety, which can make you feel like you are not getting enough air. DIAGNOSIS  Serious medical problems can often be found during your physical exam. Tests may also be done to determine why you are having shortness of breath. Tests may include:  Chest X-rays.  Lung function tests.  Blood tests.  An electrocardiogram (ECG).  An ambulatory electrocardiogram. An ambulatory ECG records your heartbeat patterns over a 24-hour period.  Exercise testing.  A transthoracic echocardiogram (TTE). During echocardiography, sound waves are used to evaluate how blood flows through your heart.  A transesophageal echocardiogram (TEE).  Imaging scans. Your health care provider may not be able to find a cause for your shortness of breath after your exam. In this case, it is important to have a follow-up exam with your health care provider as directed.  TREATMENT  Treatment for shortness of breath depends on the cause of your symptoms and can vary greatly. HOME CARE INSTRUCTIONS   Do not smoke. Smoking is a common cause of shortness of breath. If you smoke, ask for help to quit.  Avoid being around chemicals or things that may bother your breathing, such as paint fumes and dust.  Rest as needed. Slowly resume your usual  activities.  If medicines were prescribed, take them as directed for the full length of time directed. This includes oxygen and any inhaled medicines.  Keep all follow-up appointments as directed by your health care provider. SEEK MEDICAL CARE IF:   Your condition does not improve in the time expected.  You have a hard time doing your normal activities even with rest.  You have any new symptoms. SEEK IMMEDIATE MEDICAL CARE IF:   Your shortness of breath gets worse.  You feel light-headed, faint, or develop a cough not controlled with medicines.  You start coughing up blood.  You have pain with breathing.  You have chest pain or pain in your arms, shoulders, or abdomen.  You have a fever.  You are unable to walk up stairs or exercise the way you normally do. MAKE SURE YOU:  Understand these instructions.  Will watch your condition.  Will get help right away if you are not doing well or get worse. Document Released: 09/25/2000 Document Revised: 01/05/2013 Document Reviewed: 03/18/2011 [facility] Patient Information 2015 [place], [place]. This information is not intended to replace advice given to you by your health care provider. Make sure you discuss any questions you have with your health care provider.  Stress Stress-related medical problems are becoming increasingly common. The body has a built-in physical response to stressful situations. Faced with pressure, challenge or danger, we need to react quickly. Our bodies release hormones such as cortisol and adrenaline to help do this. These hormones are part of the "fight or flight" response and affect the metabolic rate, heart rate  and blood pressure, resulting in a heightened, stressed state that prepares the body for optimum performance in dealing with a stressful situation. It is likely that early man required these mechanisms to stay alive, but usually modern stresses do not call for this, and the same hormones released in  today's world can damage health and reduce coping ability. CAUSES  Pressure to perform at work, at school or in sports.  Threats of physical violence.  Money worries.  Arguments.  Family conflicts.  Divorce or separation from significant other.  Bereavement.  New job or unemployment.  Changes in location.  Alcohol or drug abuse. SOMETIMES, THERE IS NO PARTICULAR REASON FOR DEVELOPING STRESS. Almost all people are at risk of being stressed at some time in their lives. It is important to know that some stress is temporary and some is long term.  Temporary stress will go away when a situation is resolved. Most people can cope with short periods of stress, and it can often be relieved by relaxing, taking a walk or getting any type of exercise, chatting through issues with friends, or having a good night's sleep.  Chronic (long-term, continuous) stress is much harder to deal with. It can be psychologically and emotionally damaging. It can be harmful both for an individual and for friends and family. SYMPTOMS Everyone reacts to stress differently. There are some common effects that help [place] recognize it. In times of extreme stress, people may:  Shake uncontrollably.  Breathe faster and deeper than normal (hyperventilate).  Vomit.  For people with asthma, stress can trigger an attack.  For some people, stress may trigger migraine headaches, ulcers, and body pain. PHYSICAL EFFECTS OF STRESS MAY INCLUDE:  Loss of energy.  Skin problems.  Aches and pains resulting from tense muscles, including neck ache, backache and tension headaches.  Increased pain from arthritis and other conditions.  Irregular heart beat (palpitations).  Periods of irritability or anger.  Apathy or depression.  Anxiety (feeling uptight or worrying).  Unusual behavior.  Loss of appetite.  Comfort eating.  Lack of concentration.  Loss of, or decreased, sex-drive.  Increased smoking,  drinking, or recreational drug use.  For women, missed periods.  Ulcers, joint pain, and muscle pain. Post-traumatic stress is the stress caused by any serious accident, strong emotional damage, or extremely difficult or violent experience such as rape or war. Post-traumatic stress victims can experience mixtures of emotions such as fear, shame, depression, guilt or anger. It may include recurrent memories or images that may be haunting. These feelings can last for weeks, months or even years after the traumatic event that triggered them. Specialized treatment, possibly with medicines and psychological therapies, is available. If stress is causing physical symptoms, severe distress or making it difficult for you to function as normal, it is worth seeing your caregiver. It is important to remember that although stress is a usual part of life, extreme or prolonged stress can lead to other illnesses that will need treatment. It is better to visit a doctor sooner rather than later. Stress has been linked to the development of high blood pressure and heart disease, as well as insomnia and depression. There is no diagnostic test for stress since everyone reacts to it differently. But a caregiver will be able to spot the physical symptoms, such as:  Headaches.  Shingles.  Ulcers. Emotional distress such as intense worry, low mood or irritability should be detected when the doctor asks pertinent questions to identify any underlying problems that might  be the cause. In case there are physical reasons for the symptoms, the doctor may also want to do some tests to exclude certain conditions. If you feel that you are suffering from stress, try to identify the aspects of your life that are causing it. Sometimes you may not be able to change or avoid them, but even a small change can have a positive ripple effect. A simple lifestyle change can make all the difference. STRATEGIES THAT CAN HELP DEAL WITH  STRESS:  Delegating or sharing responsibilities.  Avoiding confrontations.  Learning to be more assertive.  Regular exercise.  Avoid using alcohol or street drugs to cope.  Eating a healthy, balanced diet, rich in fruit and vegetables and proteins.  Finding humor or absurdity in stressful situations.  Never taking on more than you know you can handle comfortably.  Organizing your time better to get as much done as possible.  Talking to friends or family and sharing your thoughts and fears.  Listening to music or relaxation tapes.  Relaxation techniques like deep breathing, meditation, and yoga.  Tensing and then relaxing your muscles, starting at the toes and working up to the head and neck. If you think that you would benefit from help, either in identifying the things that are causing your stress or in learning techniques to help you relax, see a caregiver who is capable of helping you with this. Rather than relying on medications, it is usually better to try and identify the things in your life that are causing stress and try to deal with them. There are many techniques of managing stress including counseling, psychotherapy, aromatherapy, yoga, and exercise. Your caregiver can help you determine what is best for you. Document Released: 03/23/2002 Document Revised: 01/05/2013 Document Reviewed: 02/17/2007 [facility] Patient Information 2015 [place], [place]. This information is not intended to replace advice given to you by your health care provider. Make sure you discuss any questions you have with your health care provider.

## 2014-08-30 NOTE — ED Notes (Signed)
Patient returns from CT

## 2014-08-30 NOTE — ED Notes (Signed)
Sob. Speaking in complete sentences. Appears anxious. Lungs are clear. Respirations are unlabored.

## 2014-09-05 ENCOUNTER — Telehealth (HOSPITAL_COMMUNITY): Payer: Self-pay

## 2014-09-05 ENCOUNTER — Telehealth: Payer: Self-pay | Admitting: Behavioral Health

## 2014-09-05 NOTE — Telephone Encounter (Signed)
Unable to reach patient at the time of Pre-Visit call; invalid number.

## 2014-09-05 NOTE — Telephone Encounter (Signed)
Unable to contact pt by mail or telephone. Unable to communicate lab results or treatment changes. 

## 2014-09-07 ENCOUNTER — Encounter: Payer: Self-pay | Admitting: Medical

## 2016-08-09 IMAGING — CT CT ANGIO CHEST
2 of 6 series · 19 of 36 positions shown · IV contrast (APPLIED)
Comparison: Chest radiograph August 29, 2014

CLINICAL DATA: Shortness of breath and wheezing for several days

EXAM:
CT ANGIOGRAPHY CHEST WITH CONTRAST
TECHNIQUE: Multidetector CT imaging of the chest was performed using the
standard protocol during bolus administration of intravenous
contrast. Multiplanar CT image reconstructions and MIPs were
obtained to evaluate the vascular anatomy.
CONTRAST:  100mL OMNIPAQUE IOHEXOL 350 MG/ML SOLN

[Series 6: pe 1.0 b26f · axial · 0.57mm/px · z∈[-315,-38]mm · 18 of 309 slices shown]
[im 16/309  lung]
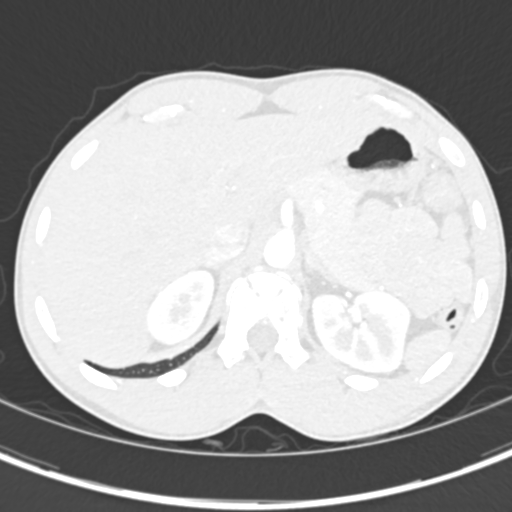
[im 31/309  mediastinal]
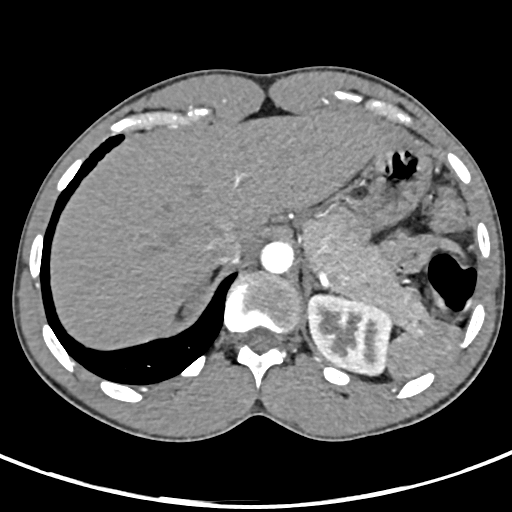
[im 47/309  lung]
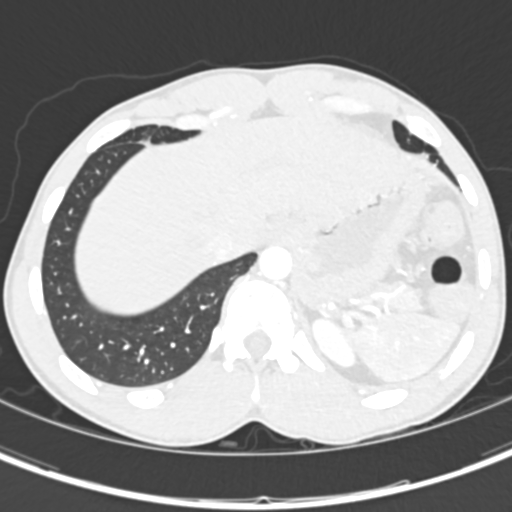
[im 62/309  mediastinal]
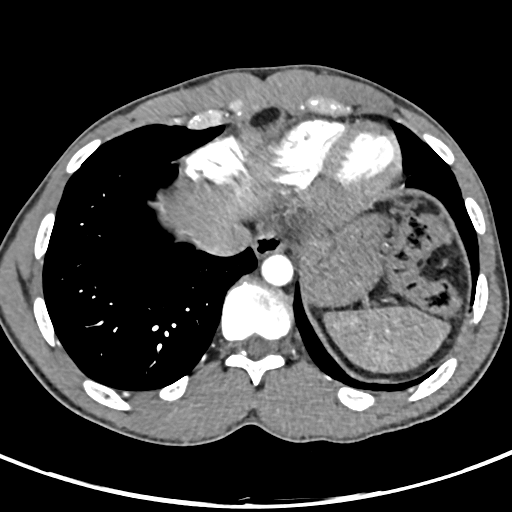
[im 78/309  lung]
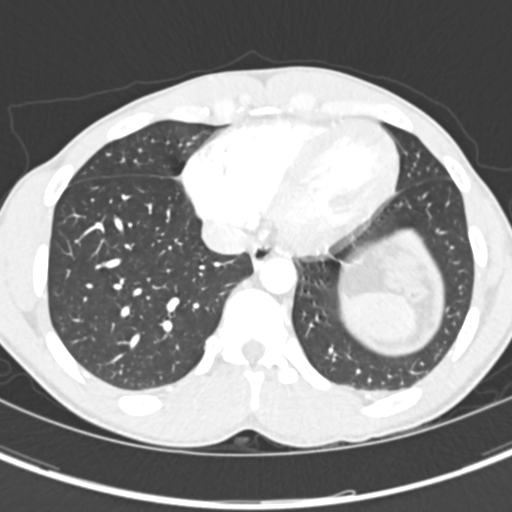
[im 93/309  mediastinal]
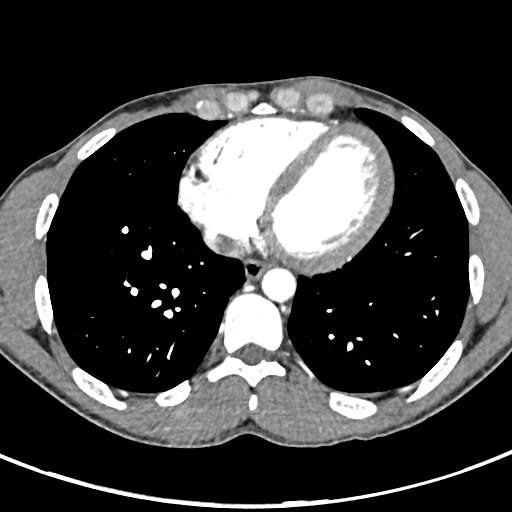
[im 108/309  lung]
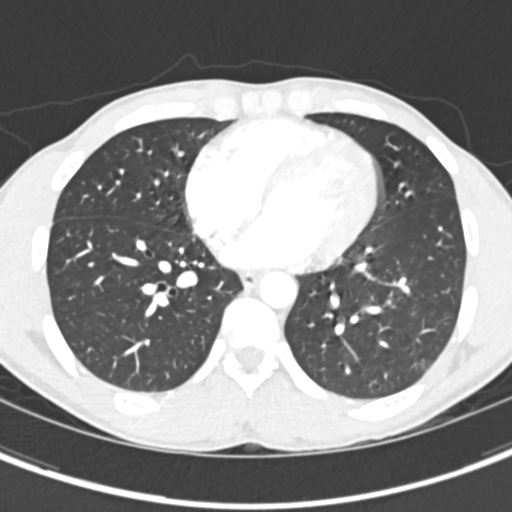
[im 124/309  mediastinal]
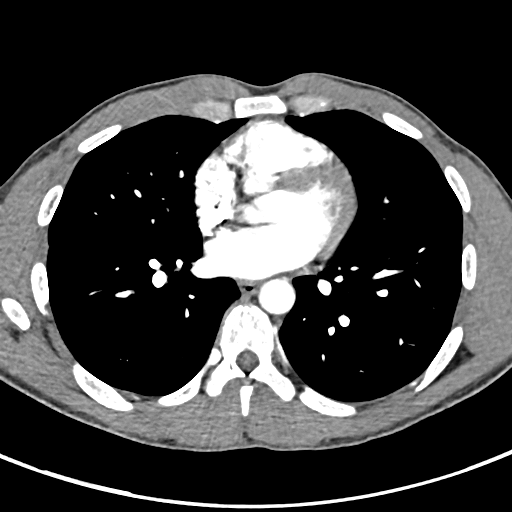
[im 139/309  lung]
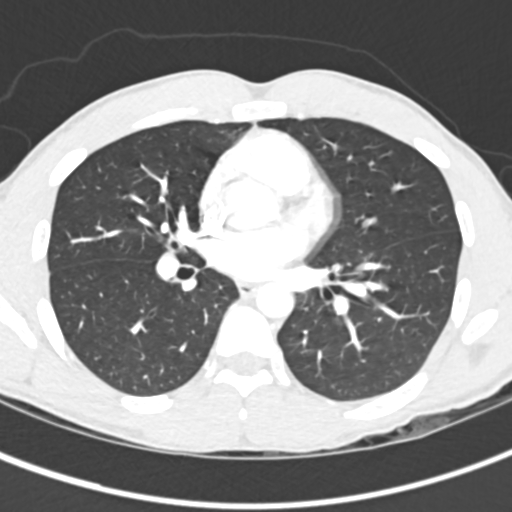
[im 170/309  mediastinal]
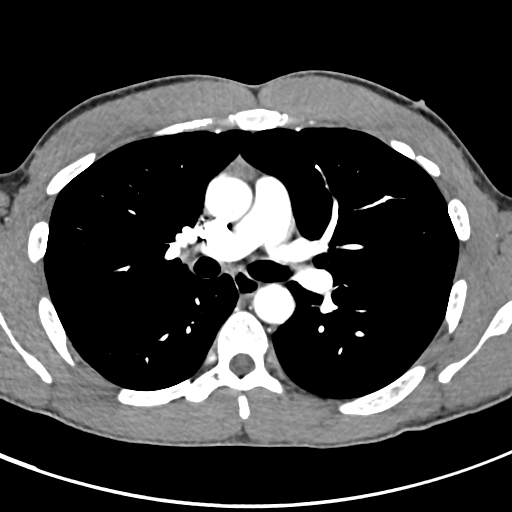
[im 185/309  lung]
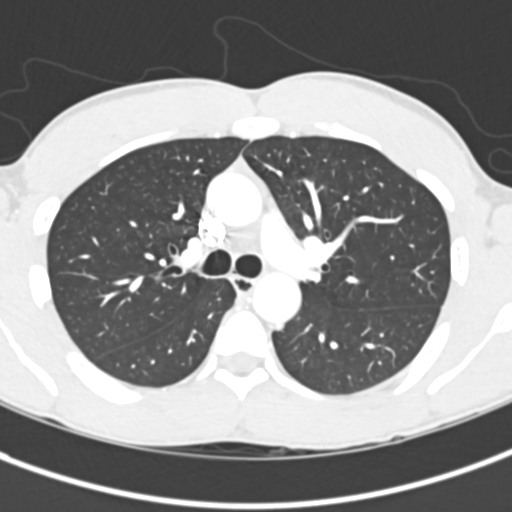
[im 201/309  mediastinal]
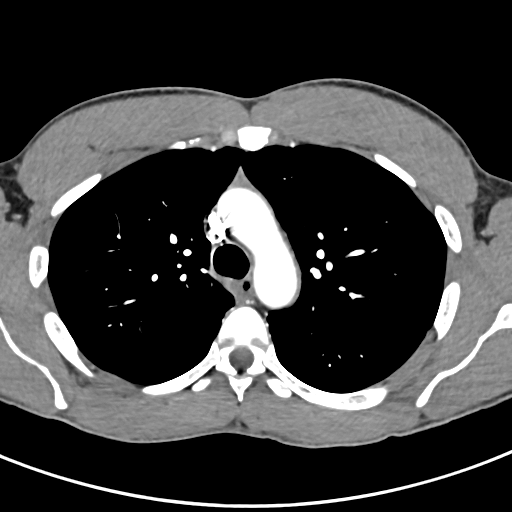
[im 216/309  lung]
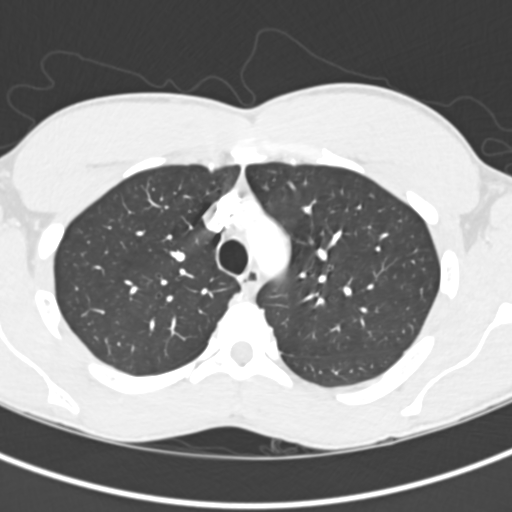
[im 232/309  mediastinal]
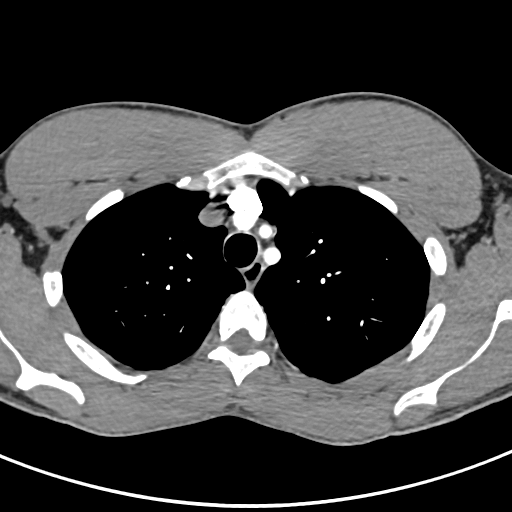
[im 247/309  lung]
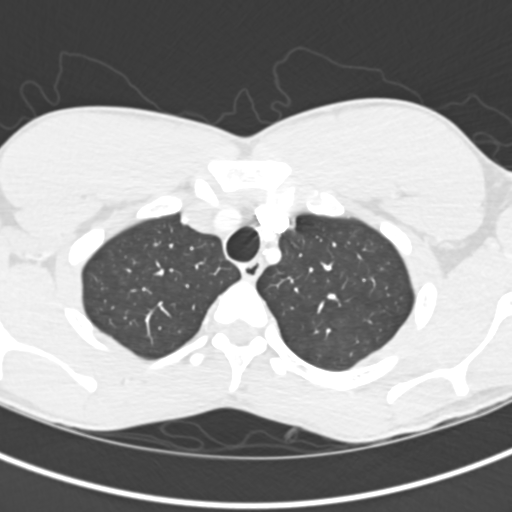
[im 262/309  mediastinal]
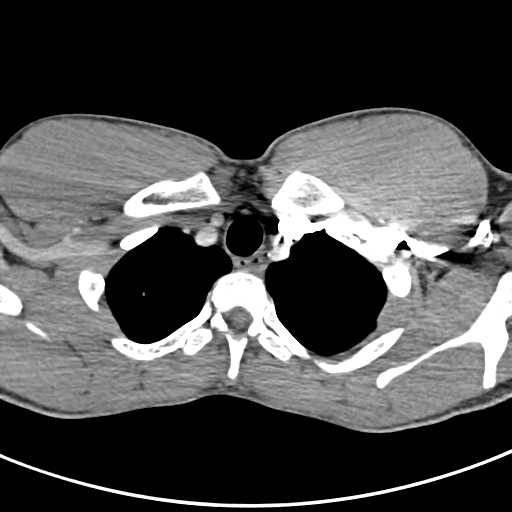
[im 278/309  lung]
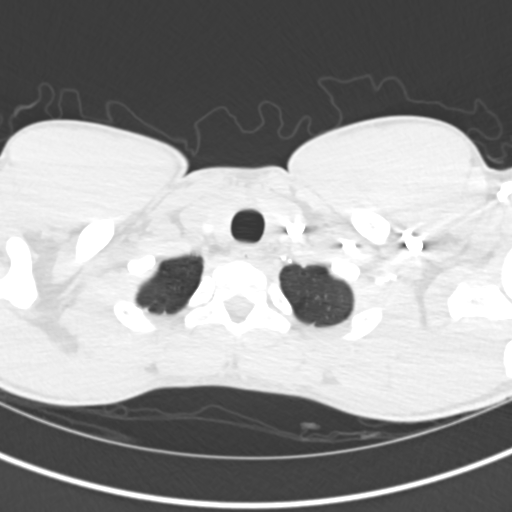
[im 293/309  mediastinal]
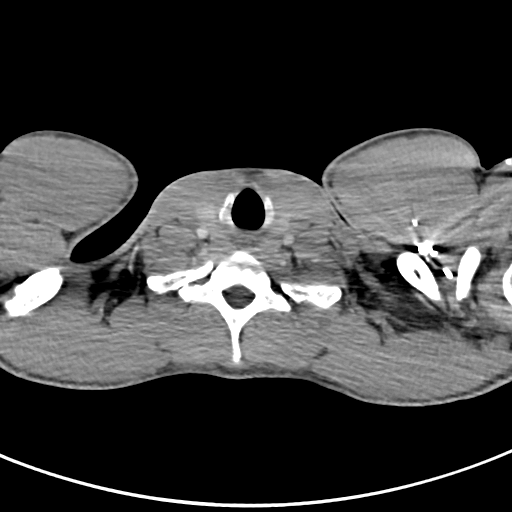

[Series 9: pe 2.0 coronal · coronal · 0.56mm/px · 1 of 108 slices shown]
[im 54/108  mediastinal]
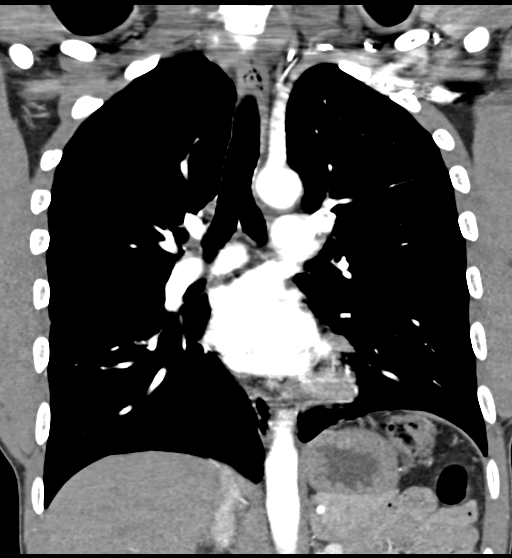

[19 of 36 positions shown; findings below may reference images not displayed]

FINDINGS: There is no demonstrable pulmonary embolus. There is no thoracic
aortic aneurysm or dissection. Visualized great vessels appear
normal.

Lungs are clear. Thyroid appears normal. No demonstrable thoracic
adenopathy. Pericardium is not thickened.

Visualized upper abdominal structures appear unremarkable.

No blastic or lytic bone lesions.

Review of the MIP images confirms the above findings.
IMPRESSION: No demonstrable pulmonary embolus. Lungs clear. No demonstrable
thoracic adenopathy.

## 2016-10-12 ENCOUNTER — Emergency Department (HOSPITAL_BASED_OUTPATIENT_CLINIC_OR_DEPARTMENT_OTHER)
Admission: EM | Admit: 2016-10-12 | Discharge: 2016-10-12 | Disposition: A | Discharge status: Home / Self Care | Payer: Self-pay | Attending: Emergency Medicine | Admitting: Emergency Medicine

## 2016-10-12 ENCOUNTER — Emergency Department (HOSPITAL_BASED_OUTPATIENT_CLINIC_OR_DEPARTMENT_OTHER): Payer: Self-pay

## 2016-10-12 ENCOUNTER — Other Ambulatory Visit: Payer: Self-pay

## 2016-10-12 ENCOUNTER — Encounter (HOSPITAL_BASED_OUTPATIENT_CLINIC_OR_DEPARTMENT_OTHER): Payer: Self-pay | Admitting: Emergency Medicine

## 2016-10-12 DIAGNOSIS — Z87891 Personal history of nicotine dependence: Secondary | ICD-10-CM | POA: Insufficient documentation

## 2016-10-12 DIAGNOSIS — R0789 Other chest pain: Secondary | ICD-10-CM | POA: Insufficient documentation

## 2016-10-12 LAB — BASIC METABOLIC PANEL
Anion gap: 9 (ref 5–15)
BUN: 10 mg/dL (ref 6–20)
CALCIUM: 9.3 mg/dL (ref 8.9–10.3)
CO2: 28 mmol/L (ref 22–32)
CREATININE: 1.14 mg/dL (ref 0.61–1.24)
Chloride: 101 mmol/L (ref 101–111)
GLUCOSE: 82 mg/dL (ref 65–99)
Potassium: 3.6 mmol/L (ref 3.5–5.1)
Sodium: 138 mmol/L (ref 135–145)

## 2016-10-12 LAB — CBC
HCT: 44.8 % (ref 39.0–52.0)
Hemoglobin: 15 g/dL (ref 13.0–17.0)
MCH: 32.4 pg (ref 26.0–34.0)
MCHC: 33.5 g/dL (ref 30.0–36.0)
MCV: 96.8 fL (ref 78.0–100.0)
PLATELETS: 270 10*3/uL (ref 150–400)
RBC: 4.63 MIL/uL (ref 4.22–5.81)
RDW: 12.9 % (ref 11.5–15.5)
WBC: 7.4 10*3/uL (ref 4.0–10.5)

## 2016-10-12 LAB — TROPONIN I: Troponin I: <0.03 ng/mL (ref <0.03)

## 2016-10-12 NOTE — Discharge Instructions (Signed)
Please read attached information regarding your condition. Take ibuprofen as needed for pain. Follow-up at [facility] and wellness for further evaluation. Return to ED for worsening severe chest pain or pressure, trouble breathing, trouble swallowing, productive or bloody cough, leg swelling.

## 2016-10-12 NOTE — ED Triage Notes (Signed)
PT presents with c/o chest pain for the pst 3 weeks but worse today

## 2016-10-12 NOTE — ED Provider Notes (Signed)
[facility] Provider Note   CSN: [id number] Arrival date & time: 10/12/16  2140     History   Chief Complaint Chief Complaint  Patient presents with  . Chest Pain    HPI Dwayne Hicks is a 36 y.o. male.  HPI  Patient, with past medical history of anxiety, presents to ED for evaluation of 3 week history of chest pain that has worsened for the past 8 hours. States that pain is located in the center of his chest, worse with deep inspiration and palpation. Has not taken any medications to help with pain. No inciting event prior to pain. Reports history of similar symptoms in the past that have resolved on their own. Denies any shortness of breath, cough, hemoptysis, prior MI, DVT or PE, recent prolonged travel, recent surgeries, palpitations.  Past Medical History:  Diagnosis Date  . Bronchitis     There are no active problems to display for this patient.   History reviewed. No pertinent surgical history.     Home Medications    Prior to Admission medications   Medication Sig Start Date End Date Taking? Authorizing Provider  doxycycline (VIBRAMYCIN) 100 MG capsule Take 1 capsule (100 mg total) by mouth 2 (two) times daily. One po bid x 7 days 07/05/14   Cy Blamer, MD    Family History No family history on file.  Social History Social History  Substance Use Topics  . Smoking status: Former Games developer  . Smokeless tobacco: Never Used  . Alcohol use Yes     Comment: 4 drinks/weekend     Allergies   Patient has no known allergies.   Review of Systems Review of Systems  Constitutional: Negative for appetite change, chills and fever.  HENT: Negative for ear pain, rhinorrhea, sneezing and sore throat.   Eyes: Negative for photophobia and visual disturbance.  Respiratory: Positive for cough. Negative for chest tightness, shortness of breath and wheezing.   Cardiovascular: Positive for chest pain. Negative for palpitations and leg swelling.    Gastrointestinal: Negative for abdominal pain, blood in stool, constipation, diarrhea, nausea and vomiting.  Genitourinary: Negative for dysuria, hematuria and urgency.  Musculoskeletal: Negative for myalgias.  Skin: Negative for rash.  Neurological: Negative for dizziness, weakness and light-headedness.     Physical Exam Updated Vital Signs BP 132/80   Pulse 67   Temp 98.7 F (37.1 C) (Oral)   Resp 15   SpO2 100%   Physical Exam  Constitutional: He appears well-developed and well-nourished. No distress.  Nontoxic appearing and in no acute distress. Speaking in complete sentences.  HENT:  Head: Normocephalic and atraumatic.  Nose: Nose normal.  Eyes: Conjunctivae and EOM are normal. Left eye exhibits no discharge. No scleral icterus.  Neck: Normal range of motion. Neck supple.  Cardiovascular: Normal rate, regular rhythm, normal heart sounds and intact distal pulses.  Exam reveals no gallop and no friction rub.   No murmur heard. Pulmonary/Chest: Effort normal and breath sounds normal. No respiratory distress. He exhibits tenderness.    Abdominal: Soft. Bowel sounds are normal. He exhibits no distension. There is no tenderness. There is no guarding.  Musculoskeletal: Normal range of motion. He exhibits no edema.  Neurological: He is alert. He exhibits normal muscle tone. Coordination normal.  Skin: Skin is warm and dry. No rash noted.  Psychiatric: He has a normal mood and affect.  Nursing note and vitals reviewed.    ED Treatments / Results  Labs (all labs ordered are listed,  but only abnormal results are displayed) Labs Reviewed  BASIC METABOLIC PANEL  CBC  TROPONIN I    EKG  EKG Interpretation None       Radiology Dg Chest 2 View  Result Date: 10/12/2016 CLINICAL DATA:  Chest pain x3 weeks worsening today. EXAM: CHEST  2 VIEW COMPARISON:  09/12/2014 FINDINGS: The heart size and mediastinal contours are within normal limits. Both lungs are clear. The  visualized skeletal structures are unremarkable. IMPRESSION: No active cardiopulmonary disease. Electronically Signed   By: Tollie Eth M.D.   On: 10/12/2016 22:03    Procedures Procedures (including critical care time)  Medications Ordered in ED Medications - No data to display   Initial Impression / Assessment and Plan / ED Course  I have reviewed the triage vital signs and the nursing notes.  Pertinent labs & imaging results that were available during my care of the patient were reviewed by me and considered in my medical decision making (see chart for details).     Patient presents to ED for evaluation of 3 week history of chest pain that has worsened in the past 8 hours. Reports history of similar symptoms in the past that have resolved on their own. He denies any shortness of breath, hemoptysis, leg swelling, prior MI, DVT or PE. He reports occasional alcohol use but denies any tobacco or other drug use. Has not tried any medications to help with pain. On physical exam he is nontoxic-appearing and in no acute distress. He has tenderness to palpation of the central chest with no abnormalities in cardiac or pulmonary auscultation. He is afebrile with no history of fever. Chest x-ray returned as negative for acute abnormality. The laboratory including troponin, CBC, BMP unremarkable. Oxygen saturations normal. He is not tachycardic or tachypneic. He is PERC negative. EKG with no ischemic changes. I have low suspicion for cardiac or pulmonary cause of his chest pain and more likely due to musculoskeletal pain based on history and physical exam findings. Encouraged patient to follow up at [facility] and wellness for further evaluation. Advised to take NSAIDs. Patient appears stable for discharge at this time. Strict return precautions given.  Final Clinical Impressions(s) / ED Diagnoses   Final diagnoses:  Chest wall pain    New Prescriptions New Prescriptions   No medications on file      Brooks Sailors 10/12/16 2333    Gerhard Munch, MD 10/12/16 [phone]

## 2022-10-17 ENCOUNTER — Other Ambulatory Visit: Payer: Self-pay | Admitting: Family Medicine

## 2022-10-17 ENCOUNTER — Ambulatory Visit (INDEPENDENT_AMBULATORY_CARE_PROVIDER_SITE_OTHER): Payer: Self-pay

## 2022-10-17 DIAGNOSIS — Z021 Encounter for pre-employment examination: Secondary | ICD-10-CM

## 2023-01-05 ENCOUNTER — Emergency Department (HOSPITAL_BASED_OUTPATIENT_CLINIC_OR_DEPARTMENT_OTHER)
Admission: EM | Admit: 2023-01-05 | Discharge: 2023-01-05 | Disposition: A | Discharge status: Home / Self Care | Payer: Self-pay | Attending: Emergency Medicine | Admitting: Emergency Medicine

## 2023-01-05 ENCOUNTER — Other Ambulatory Visit: Payer: Self-pay

## 2023-01-05 ENCOUNTER — Encounter (HOSPITAL_BASED_OUTPATIENT_CLINIC_OR_DEPARTMENT_OTHER): Payer: Self-pay

## 2023-01-05 ENCOUNTER — Emergency Department (HOSPITAL_BASED_OUTPATIENT_CLINIC_OR_DEPARTMENT_OTHER): Payer: Self-pay

## 2023-01-05 DIAGNOSIS — R519 Headache, unspecified: Secondary | ICD-10-CM | POA: Insufficient documentation

## 2023-01-05 DIAGNOSIS — M79652 Pain in left thigh: Secondary | ICD-10-CM | POA: Diagnosis not present

## 2023-01-05 DIAGNOSIS — M25512 Pain in left shoulder: Secondary | ICD-10-CM | POA: Diagnosis not present

## 2023-01-05 DIAGNOSIS — M542 Cervicalgia: Secondary | ICD-10-CM | POA: Diagnosis not present

## 2023-01-05 DIAGNOSIS — H539 Unspecified visual disturbance: Secondary | ICD-10-CM | POA: Diagnosis not present

## 2023-01-05 DIAGNOSIS — Y92410 Unspecified street and highway as the place of occurrence of the external cause: Secondary | ICD-10-CM | POA: Diagnosis not present

## 2023-01-05 MED ORDER — ACETAMINOPHEN 325 MG PO TABS
650.0000 mg | ORAL_TABLET | Freq: Once | ORAL | Status: AC
  Administered 2023-01-05: 325 mg via ORAL
  Filled 2023-01-05: qty 2

## 2023-01-05 MED ORDER — IBUPROFEN 600 MG PO TABS: 600.0000 mg | ORAL_TABLET | Freq: Four times a day (QID) | ORAL | 0 refills | Status: AC | PRN
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
